# Patient Record
Sex: Male | Born: 2000 | Race: White | Hispanic: No | Marital: Single
Health system: Southern US, Community
[De-identification: ages and names within clinical notes are randomized; demographics above are authoritative.]

## PROBLEM LIST (undated history)

## (undated) HISTORY — PX: HAND SURGERY: SHX662

---
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
## 2019-09-22 DIAGNOSIS — S62306A Unspecified fracture of fifth metacarpal bone, right hand, initial encounter for closed fracture: Secondary | ICD-10-CM | POA: Diagnosis not present

## 2019-09-22 DIAGNOSIS — S6291XA Unspecified fracture of right wrist and hand, initial encounter for closed fracture: Secondary | ICD-10-CM | POA: Diagnosis not present

## 2019-09-25 DIAGNOSIS — S62336A Displaced fracture of neck of fifth metacarpal bone, right hand, initial encounter for closed fracture: Secondary | ICD-10-CM | POA: Diagnosis not present

## 2019-09-26 DIAGNOSIS — S62336A Displaced fracture of neck of fifth metacarpal bone, right hand, initial encounter for closed fracture: Secondary | ICD-10-CM | POA: Diagnosis not present

## 2019-09-26 MED FILL — HYDROCODON-APAP 5-325: 5-325 | 5 days supply | Qty: 20 | Fill #0

## 2019-10-01 DIAGNOSIS — S62336D Displaced fracture of neck of fifth metacarpal bone, right hand, subsequent encounter for fracture with routine healing: Secondary | ICD-10-CM | POA: Diagnosis not present

## 2019-10-01 DIAGNOSIS — M79641 Pain in right hand: Secondary | ICD-10-CM | POA: Diagnosis not present

## 2019-10-01 DIAGNOSIS — M7989 Other specified soft tissue disorders: Secondary | ICD-10-CM | POA: Diagnosis not present

## 2019-10-01 DIAGNOSIS — S62336A Displaced fracture of neck of fifth metacarpal bone, right hand, initial encounter for closed fracture: Secondary | ICD-10-CM | POA: Diagnosis not present

## 2019-10-23 DIAGNOSIS — S62336A Displaced fracture of neck of fifth metacarpal bone, right hand, initial encounter for closed fracture: Secondary | ICD-10-CM | POA: Diagnosis not present

## 2019-10-23 DIAGNOSIS — W2209XA Striking against other stationary object, initial encounter: Secondary | ICD-10-CM | POA: Diagnosis not present

## 2019-11-13 DIAGNOSIS — S62336A Displaced fracture of neck of fifth metacarpal bone, right hand, initial encounter for closed fracture: Secondary | ICD-10-CM | POA: Diagnosis not present

## 2019-11-16 DIAGNOSIS — R29898 Other symptoms and signs involving the musculoskeletal system: Secondary | ICD-10-CM | POA: Diagnosis not present

## 2019-11-16 DIAGNOSIS — S62336D Displaced fracture of neck of fifth metacarpal bone, right hand, subsequent encounter for fracture with routine healing: Secondary | ICD-10-CM | POA: Diagnosis not present

## 2019-11-16 DIAGNOSIS — M25641 Stiffness of right hand, not elsewhere classified: Secondary | ICD-10-CM | POA: Diagnosis not present

## 2019-12-10 DIAGNOSIS — Z4789 Encounter for other orthopedic aftercare: Secondary | ICD-10-CM | POA: Diagnosis not present

## 2019-12-17 DIAGNOSIS — S62336D Displaced fracture of neck of fifth metacarpal bone, right hand, subsequent encounter for fracture with routine healing: Secondary | ICD-10-CM | POA: Diagnosis not present

## 2019-12-17 DIAGNOSIS — R29898 Other symptoms and signs involving the musculoskeletal system: Secondary | ICD-10-CM | POA: Diagnosis not present

## 2019-12-17 DIAGNOSIS — M25641 Stiffness of right hand, not elsewhere classified: Secondary | ICD-10-CM | POA: Diagnosis not present

## 2021-09-27 ENCOUNTER — Telehealth: Payer: 59 | Admitting: Emergency Medicine

## 2021-09-27 DIAGNOSIS — J038 Acute tonsillitis due to other specified organisms: Secondary | ICD-10-CM | POA: Diagnosis not present

## 2021-09-27 DIAGNOSIS — J029 Acute pharyngitis, unspecified: Secondary | ICD-10-CM

## 2021-09-27 DIAGNOSIS — R051 Acute cough: Secondary | ICD-10-CM

## 2021-09-27 DIAGNOSIS — B9689 Other specified bacterial agents as the cause of diseases classified elsewhere: Secondary | ICD-10-CM | POA: Diagnosis not present

## 2021-09-27 MED ORDER — AMOXICILLIN 500 MG PO CAPS: 500.0000 mg | ORAL_CAPSULE | Freq: Three times a day (TID) | ORAL | 0 refills | Status: AC

## 2021-09-27 MED ORDER — CHLORHEXIDINE GLUCONATE 0.12 % MT SOLN: 15.0000 mL | Freq: Two times a day (BID) | OROMUCOSAL | 0 refills | Status: AC

## 2021-09-27 NOTE — Patient Instructions (Addendum)
  [name], thank you for joining [name], PA-C for today's virtual visit.  While this provider is not your primary care provider (PCP), if your PCP is located in our provider database this encounter information will be shared with them immediately following your visit.  Consent: (Patient) Evan Long provided verbal consent for this virtual visit at the beginning of the encounter.  Current Medications:  Current Outpatient Medications:    amoxicillin (AMOXIL) 500 MG capsule, Take 1 capsule (500 mg total) by mouth 3 (three) times daily for 10 days., Disp: 30 capsule, Rfl: 0   chlorhexidine (PERIDEX) 0.12 % solution, Use as directed 15 mLs in the mouth or throat 2 (two) times daily., Disp: 473 mL, Rfl: 0   Medications ordered in this encounter:  Meds ordered this encounter  Medications   amoxicillin (AMOXIL) 500 MG capsule    Sig: Take 1 capsule (500 mg total) by mouth 3 (three) times daily for 10 days.    Dispense:  30 capsule    Refill:  0    Order Specific Question:   Supervising Provider    Answer:   MILLER, BRIAN [[id number]]   chlorhexidine (PERIDEX) 0.12 % solution    Sig: Use as directed 15 mLs in the mouth or throat 2 (two) times daily.    Dispense:  473 mL    Refill:  0    Order Specific Question:   Supervising Provider    Answer:   Sabra Heck, BRIAN [[id number]]     *If you need refills on other medications prior to your next appointment, please contact your pharmacy*  Follow-Up: Call back or seek an in-person evaluation if the symptoms worsen or if the condition fails to improve as anticipated.  Other Instructions Based on symptoms I am concerned for bacterial infection of the throat I will prescribed amoxicillin.  Take as directed and to completion Peridex prescribed for symptoms relief of painful throat Get plenty of rest and push fluids Use zyrtec for nasal congestion, runny nose, and/or sore throat Use flonase for nasal congestion and runny nose Use medications daily  for symptom relief Use OTC medications like ibuprofen or tylenol as needed fever or pain Seek in person evaluation with PCP, at urgent care, or call 911 or go to the ED if you have any new or worsening symptoms such as fever, cough, shortness of breath, chest tightness, chest pain, worsening sore throat, drooling, trouble swallowing, etc...   If you have been instructed to have an in-person evaluation today at a local Urgent Care facility, please use the link below. It will take you to a list of all of our available [facility], including address, phone number and hours of operation. Please do not delay care.  [facility]  If you or a family member do not have a primary care provider, use the link below to schedule a visit and establish care. When you choose a [facility] primary care physician or advanced practice provider, you gain a long-term partner in health. Find a Primary Care Provider  Learn more about [facility]'s in-office and virtual care options: [facility]

## 2021-09-27 NOTE — Progress Notes (Signed)
Virtual Visit Consent   Evan Long, you are scheduled for a virtual visit with a [facility] provider today.     Just as with appointments in the office, your consent must be obtained to participate.  Your consent will be active for this visit and any virtual visit you may have with one of our providers in the next 365 days.     If you have a MyChart account, a copy of this consent can be sent to you electronically.  All virtual visits are billed to your insurance company just like a traditional visit in the office.    As this is a virtual visit, video technology does not allow for your provider to perform a traditional examination.  This may limit your provider's ability to fully assess your condition.  If your provider identifies any concerns that need to be evaluated in person or the need to arrange testing (such as labs, EKG, etc.), we will make arrangements to do so.     Although advances in technology are sophisticated, we cannot ensure that it will always work on either your end or our end.  If the connection with a video visit is poor, the visit may have to be switched to a telephone visit.  With either a video or telephone visit, we are not always able to ensure that we have a secure connection.     I need to obtain your verbal consent now.   Are you willing to proceed with your visit today? yes   Evan Long has provided verbal consent on 09/27/2021 for a virtual visit (video or telephone).   Evan Long, Evan Long   Date: 09/27/2021 9:23 AM   Virtual Visit via Video Note   I, Evan Long, connected with  Evan Long  ([id number], 09/24/2001) on 09/27/21 at  9:15 AM EST by a video-enabled telemedicine application and verified that I am speaking with the correct person using two identifiers.  Location: Patient: Virtual Visit Location Patient: Home Provider: Virtual Visit Location Provider: Home Office   I discussed the limitations of evaluation and management by  telemedicine and the availability of in person appointments. The patient expressed understanding and agreed to proceed.    History of Present Illness: Evan Long is a 20 y.o. who identifies as a male who was assigned male at birth, and is being seen today for congestion, sore throat, tonsil swollen, and productive cough with mucus 3-4 days.  Denies sick exposure to COVID, flu or strep. Has NOT tried OTC medications without relief.  Symptoms are made worse with swallowing, but tolerating liquids and own secretions.  Denies previous symptoms in the past.   Complains of fever with tmax of 101-102, chills.  Denies SOB, wheezing, chest pain, nausea, changes in bowel or bladder habits.    ROS: As per HPI.  All other pertinent ROS negative.     HPI: HPI  Problems: There are no problems to display for this patient.   Allergies: No Known Allergies Medications:  Current Outpatient Medications:    amoxicillin (AMOXIL) 500 MG capsule, Take 1 capsule (500 mg total) by mouth 3 (three) times daily for 10 days., Disp: 30 capsule, Rfl: 0   chlorhexidine (PERIDEX) 0.12 % solution, Use as directed 15 mLs in the mouth or throat 2 (two) times daily., Disp: 473 mL, Rfl: 0  Observations/Objective: Patient is well-developed, well-nourished in no acute distress.  Resting comfortably at home. Mildly fatigued appearing, but nontoxic Head is normocephalic, atraumatic.  No labored breathing. Speaking in full sentences and tolerating own secretions Speech is clear and coherent with logical content.  Patient is alert and oriented at baseline.    Assessment and Plan: 1. Sore throat  2. Bacterial tonsillitis  3. Acute cough Based on symptoms I am concerned for bacterial infection of the throat I will prescribed amoxicillin.  Take as directed and to completion Peridex prescribed for symptoms relief of painful throat Get plenty of rest and push fluids Use zyrtec for nasal congestion, runny nose, and/or sore  throat Use flonase for nasal congestion and runny nose Use medications daily for symptom relief Use OTC medications like ibuprofen or tylenol as needed fever or pain Seek in person evaluation with PCP, at urgent care, or call 911 or go to the ED if you have any new or worsening symptoms such as fever, cough, shortness of breath, chest tightness, chest pain, worsening sore throat, drooling, trouble swallowing, etc...    Follow Up Instructions: I discussed the assessment and treatment plan with the patient. The patient was provided an opportunity to ask questions and all were answered. The patient agreed with the plan and demonstrated an understanding of the instructions.  A copy of instructions were sent to the patient via MyChart unless otherwise noted below.   The patient was advised to call back or seek an in-person evaluation if the symptoms worsen or if the condition fails to improve as anticipated.  Time:  I spent 10  minutes with the patient via telehealth technology discussing the above problems/concerns.    Evan Box, PA-C

## 2024-06-21 DIAGNOSIS — S0081XA Abrasion of other part of head, initial encounter: Secondary | ICD-10-CM | POA: Diagnosis not present

## 2024-06-21 DIAGNOSIS — S30811A Abrasion of abdominal wall, initial encounter: Secondary | ICD-10-CM | POA: Diagnosis not present

## 2024-06-21 DIAGNOSIS — M542 Cervicalgia: Secondary | ICD-10-CM | POA: Diagnosis not present

## 2024-06-21 DIAGNOSIS — R519 Headache, unspecified: Secondary | ICD-10-CM | POA: Diagnosis not present

## 2024-06-21 DIAGNOSIS — S060XAA Concussion with loss of consciousness status unknown, initial encounter: Secondary | ICD-10-CM | POA: Diagnosis not present

## 2024-06-21 DIAGNOSIS — W19XXXA Unspecified fall, initial encounter: Secondary | ICD-10-CM | POA: Diagnosis not present

## 2024-06-21 DIAGNOSIS — R410 Disorientation, unspecified: Secondary | ICD-10-CM | POA: Diagnosis not present

## 2024-06-21 DIAGNOSIS — S0990XA Unspecified injury of head, initial encounter: Secondary | ICD-10-CM | POA: Diagnosis not present

## 2024-06-21 DIAGNOSIS — S0101XA Laceration without foreign body of scalp, initial encounter: Secondary | ICD-10-CM | POA: Diagnosis not present

## 2024-06-21 DIAGNOSIS — S299XXA Unspecified injury of thorax, initial encounter: Secondary | ICD-10-CM | POA: Diagnosis not present

## 2024-06-21 DIAGNOSIS — M545 Low back pain, unspecified: Secondary | ICD-10-CM | POA: Diagnosis not present

## 2024-06-21 DIAGNOSIS — Q631 Lobulated, fused and horseshoe kidney: Secondary | ICD-10-CM | POA: Diagnosis not present

## 2024-08-07 DIAGNOSIS — B084 Enteroviral vesicular stomatitis with exanthem: Secondary | ICD-10-CM | POA: Diagnosis not present

## 2024-08-07 DIAGNOSIS — B09 Unspecified viral infection characterized by skin and mucous membrane lesions: Secondary | ICD-10-CM | POA: Diagnosis not present
